# Patient Record
Sex: Female | Born: 1960 | Race: White | Hispanic: No | Marital: Married
Health system: Southern US, Community
[De-identification: ages and names within clinical notes are randomized; demographics above are authoritative.]

---

## 2021-03-11 ENCOUNTER — Emergency Department (HOSPITAL_COMMUNITY)
Admission: EM | Admit: 2021-03-11 | Discharge: 2021-03-11 | Disposition: A | Discharge status: Home / Self Care | Payer: BC Managed Care – PPO | Attending: Emergency Medicine | Admitting: Emergency Medicine

## 2021-03-11 ENCOUNTER — Emergency Department (HOSPITAL_COMMUNITY): Payer: BC Managed Care – PPO

## 2021-03-11 DIAGNOSIS — W228XXA Striking against or struck by other objects, initial encounter: Secondary | ICD-10-CM | POA: Insufficient documentation

## 2021-03-11 DIAGNOSIS — S0990XA Unspecified injury of head, initial encounter: Secondary | ICD-10-CM | POA: Diagnosis present

## 2021-03-11 DIAGNOSIS — Y9302 Activity, running: Secondary | ICD-10-CM | POA: Diagnosis not present

## 2021-03-11 DIAGNOSIS — S0993XA Unspecified injury of face, initial encounter: Secondary | ICD-10-CM | POA: Insufficient documentation

## 2021-03-11 MED ORDER — ONDANSETRON 4 MG PO TBDP: 4.0000 mg | ORAL_TABLET | Freq: Three times a day (TID) | ORAL | 0 refills | Status: AC | PRN

## 2021-03-11 NOTE — Discharge Instructions (Addendum)
Apply ice for comfort, this also for swelling.  If the swelling goes down and you are concerned about the shape or function of the nose follow-up with ENT group below.  Headache use over-the-counter medications as described below.  Use Zofran for nausea vomiting.  Follow-up with primary care for clearance to return to physical activity.  You can take 600 mg of ibuprofen every 6 hours, you can take 1000 mg of Tylenol every 6 hours, you can alternate these every 3 or you can take them together.   [facility]  Located in: [facility] Address: [address] [place], [place], [place] [address] Phone: [phone]

## 2021-03-11 NOTE — ED Triage Notes (Signed)
Pt ran into a door at the gym , pt hit her nose , pt is slightly  Swollen and crooked

## 2021-03-11 NOTE — ED Provider Notes (Signed)
[facility] EMERGENCY DEPARTMENT Provider Note   CSN: [id number] Arrival date & time: 03/11/21  0630     History No chief complaint on file. Notes injury  Crystal Valdez is a 60 y.o. female with no significant medical history presents for evaluation of nose pain.  Patient states she went to the gym to workout she ran into a glass door.  She has had nose tenderness since then and also became dizzy after her workout.  She has had associated swelling of the nose with some difficulty breathing from the right nostril.  Patient denies any headaches, blurry vision, nosebleeds, injury to the mouth, shortness of breath or chest pain.    No past medical history on file.  There are no problems to display for this patient.    OB History   No obstetric history on file.     No family history on file.     Home Medications Prior to Admission medications   Medication Sig Start Date End Date Taking? Authorizing Provider  ondansetron (ZOFRAN ODT) 4 MG disintegrating tablet Take 1 tablet (4 mg total) by mouth every 8 (eight) hours as needed for up to 10 doses for nausea or vomiting. 03/11/21  Yes Sabino Donovan, MD    Allergies    Patient has no allergy information on record.  Review of Systems   Review of Systems  Constitutional: Negative for activity change, chills and fever.  HENT: Positive for sinus pain. Negative for nosebleeds and tinnitus.        Nose pain  Eyes: Negative for pain and visual disturbance.  Respiratory: Negative for shortness of breath.   Gastrointestinal: Negative for nausea and vomiting.  Musculoskeletal: Negative for back pain and neck pain.  Skin: Negative for rash.  Neurological: Negative for dizziness, weakness and light-headedness.    Physical Exam Updated Vital Signs BP 120/64 (BP Location: Right Arm)   Pulse (!) 42   Temp 97.8 F (36.6 C) (Oral)   Resp 16   SpO2 100%   Physical Exam Constitutional:      Appearance: Normal  appearance.  HENT:     Head: Normocephalic and atraumatic.     Nose: No congestion or rhinorrhea.     Comments: Mild tenderness to palpation of the nose with mild edema of the superior aspect of the nose. Patent nares. No evidence of bleeding.     Mouth/Throat:     Mouth: Mucous membranes are moist.     Pharynx: Oropharynx is clear.  Eyes:     Extraocular Movements: Extraocular movements intact.     Conjunctiva/sclera: Conjunctivae normal.     Pupils: Pupils are equal, round, and reactive to light.  Cardiovascular:     Rate and Rhythm: Normal rate.     Pulses: Normal pulses.     Heart sounds: Normal heart sounds.  Pulmonary:     Effort: Pulmonary effort is normal.     Breath sounds: Normal breath sounds.  Abdominal:     General: Abdomen is flat.  Musculoskeletal:        General: Normal range of motion.     Cervical back: Normal range of motion and neck supple. No rigidity.  Skin:    General: Skin is warm and dry.  Neurological:     General: No focal deficit present.     Mental Status: She is alert and oriented to person, place, and time.     ED Results / Procedures / Treatments   Labs (all labs  ordered are listed, but only abnormal results are displayed) Labs Reviewed - No data to display  EKG None  Radiology CT Head Wo Contrast  Result Date: 03/11/2021 CLINICAL DATA:  Blunt facial trauma, initial encounter EXAM: CT HEAD WITHOUT CONTRAST CT MAXILLOFACIAL WITHOUT CONTRAST TECHNIQUE: Multidetector CT imaging of the head and maxillofacial structures were performed using the standard protocol without intravenous contrast. Multiplanar CT image reconstructions of the maxillofacial structures were also generated. COMPARISON:  None. FINDINGS: CT HEAD FINDINGS Brain: No evidence of acute infarction, hemorrhage, hydrocephalus, extra-axial collection or mass lesion/mass effect. Calcification is noted in the left occipital lobe along the tentorium. Vascular: No hyperdense vessel or  unexpected calcification. Skull: Normal. Negative for fracture or focal lesion. Other: None. CT MAXILLOFACIAL FINDINGS Osseous: No acute bony fracture is noted. Orbits: Orbits and their contents are within normal limits. Sinuses: Paranasal sinuses are well aerated. Soft tissues: No soft tissue swelling is seen. No radiopaque foreign bodies are noted. IMPRESSION: CT of the head: No acute intracranial abnormality noted. Tentorial calcifications are noted on the left. CT of the maxillofacial bones: No acute abnormality noted. Electronically Signed   By: Alcide Clever M.D.   On: 03/11/2021 11:24   CT Maxillofacial Wo Contrast  Result Date: 03/11/2021 CLINICAL DATA:  Blunt facial trauma, initial encounter EXAM: CT HEAD WITHOUT CONTRAST CT MAXILLOFACIAL WITHOUT CONTRAST TECHNIQUE: Multidetector CT imaging of the head and maxillofacial structures were performed using the standard protocol without intravenous contrast. Multiplanar CT image reconstructions of the maxillofacial structures were also generated. COMPARISON:  None. FINDINGS: CT HEAD FINDINGS Brain: No evidence of acute infarction, hemorrhage, hydrocephalus, extra-axial collection or mass lesion/mass effect. Calcification is noted in the left occipital lobe along the tentorium. Vascular: No hyperdense vessel or unexpected calcification. Skull: Normal. Negative for fracture or focal lesion. Other: None. CT MAXILLOFACIAL FINDINGS Osseous: No acute bony fracture is noted. Orbits: Orbits and their contents are within normal limits. Sinuses: Paranasal sinuses are well aerated. Soft tissues: No soft tissue swelling is seen. No radiopaque foreign bodies are noted. IMPRESSION: CT of the head: No acute intracranial abnormality noted. Tentorial calcifications are noted on the left. CT of the maxillofacial bones: No acute abnormality noted. Electronically Signed   By: Alcide Clever M.D.   On: 03/11/2021 11:24    Procedures Procedures   Medications Ordered in  ED Medications - No data to display  ED Course  I have reviewed the triage vital signs and the nursing notes.  Pertinent labs & imaging results that were available during my care of the patient were reviewed by me and considered in my medical decision making (see chart for details).    MDM Rules/Calculators/A&P                          60 year old female here for evaluation of nose pain after running into a glass door while at the gym. Patient endorsed nose pain with mild dizziness and swelling. On exam, patient has some tenderness to palpation of the nose with mild swelling but nares are patent, no evidence of epistaxis and no shortness of breath. CT head shows no acute intracranial abnormality. CT maxillofacial negative for acute bony fracture or soft tissue swelling. Patient discharged home with instructions for conservative management of her facial injury and instructions to follow-up with ENT as needed. Patient was also given as needed Zofran for nausea.  Final Clinical Impression(s) / ED Diagnoses Final diagnoses:  Facial injury, initial encounter  Rx / DC Orders ED Discharge Orders         Ordered    ondansetron (ZOFRAN ODT) 4 MG disintegrating tablet  Every 8 hours PRN        03/11/21 1202           Steffanie Rainwater, MD 03/11/21 1556    Sabino Donovan, MD 03/12/21 [id number]

## 2022-04-01 IMAGING — CT CT MAXILLOFACIAL W/O CM
3 series · 16 of 47 positions shown, 19 images · non-contrast
Comparison: None.

CLINICAL DATA: Blunt facial trauma, initial encounter

EXAM:
CT HEAD WITHOUT CONTRAST
CT MAXILLOFACIAL WITHOUT CONTRAST
TECHNIQUE: Multidetector CT imaging of the head and maxillofacial structures
were performed using the standard protocol without intravenous
contrast. Multiplanar CT image reconstructions of the maxillofacial
structures were also generated.

[Series 3: facialbone 2.0 st · axial · 0.33mm/px · z∈[-34,+94]mm · 10 of 76 slices shown, 13 images]
[im 6/76  brain]
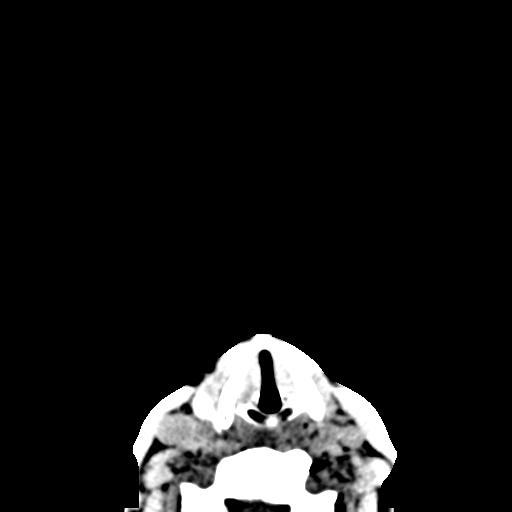
[im 6/76  bone]
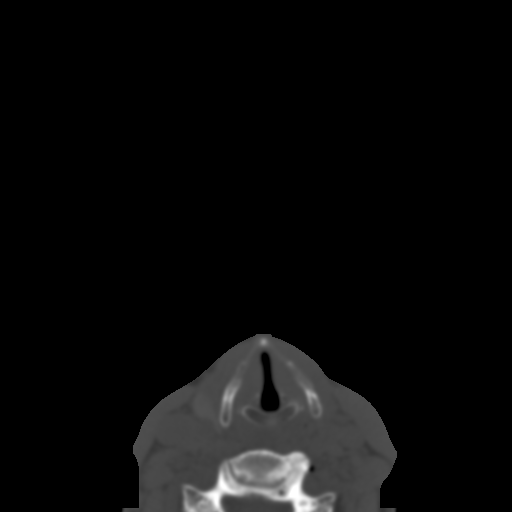
[im 13/76  bone]
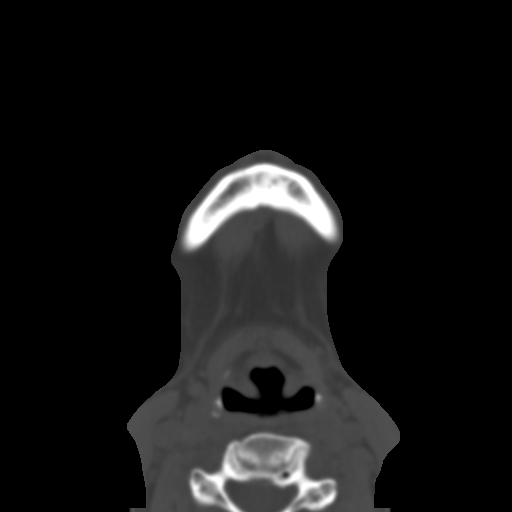
[im 21/76  bone]
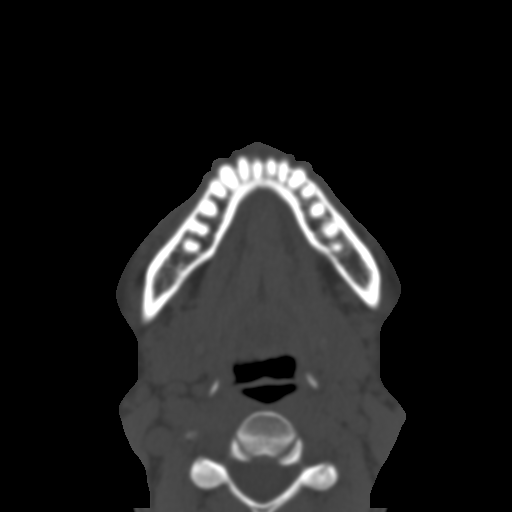
[im 26/76  bone]
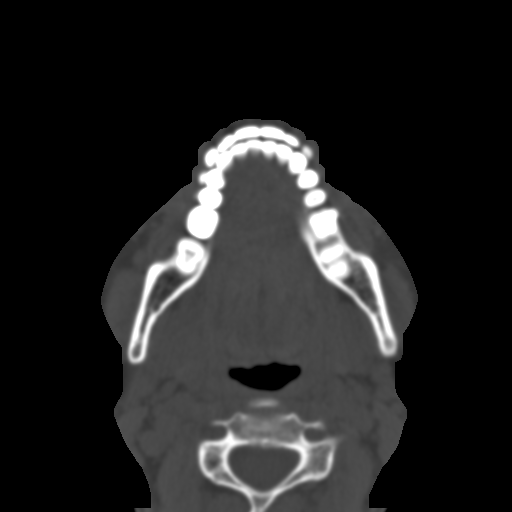
[im 34/76  brain]
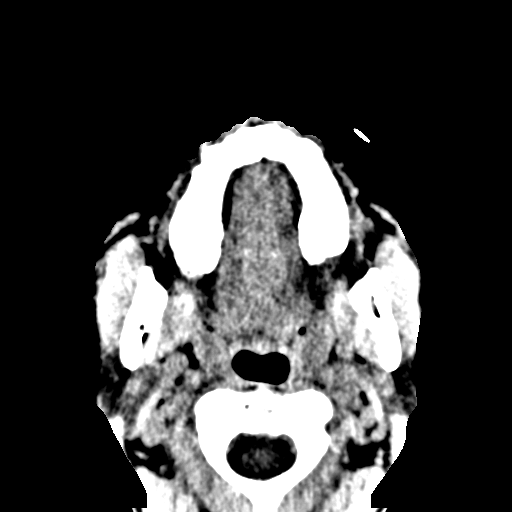
[im 34/76  bone]
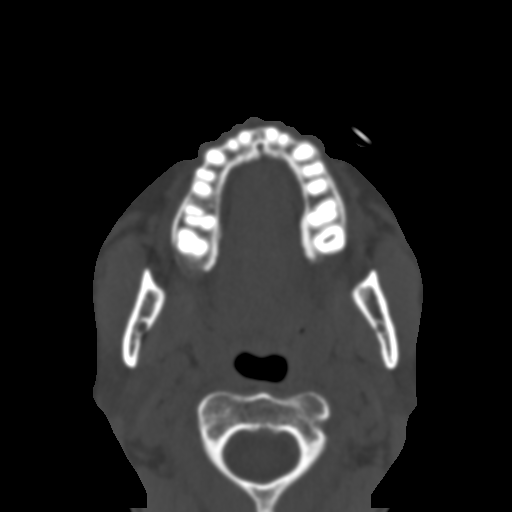
[im 42/76  bone]
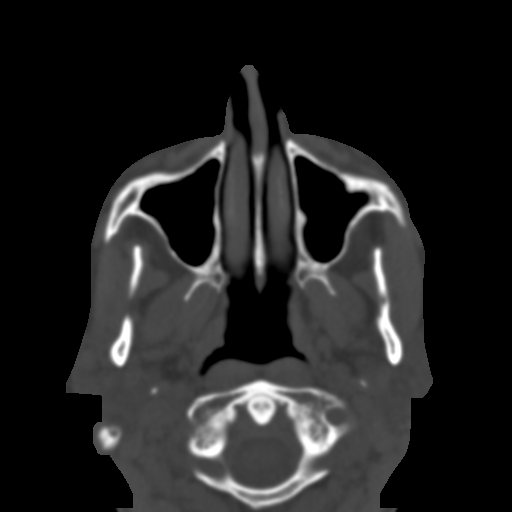
[im 50/76  bone]
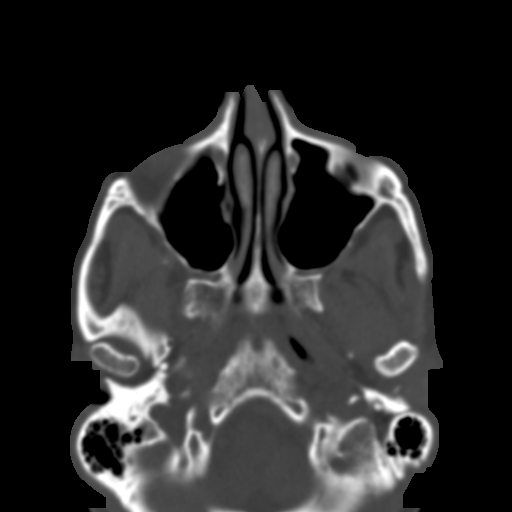
[im 57/76  bone]
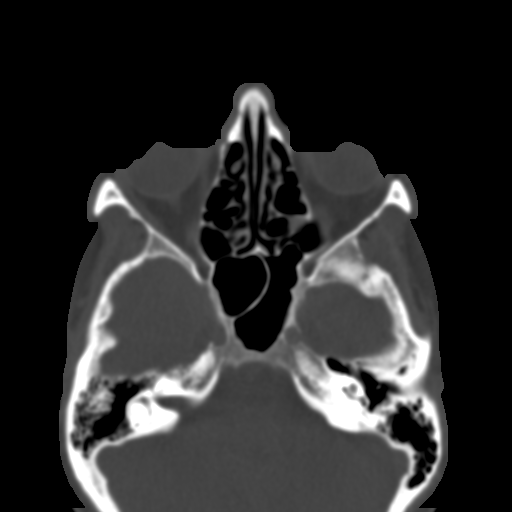
[im 63/76  brain]
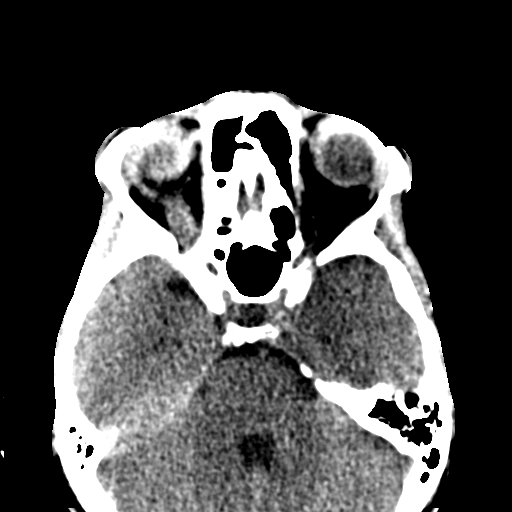
[im 63/76  bone]
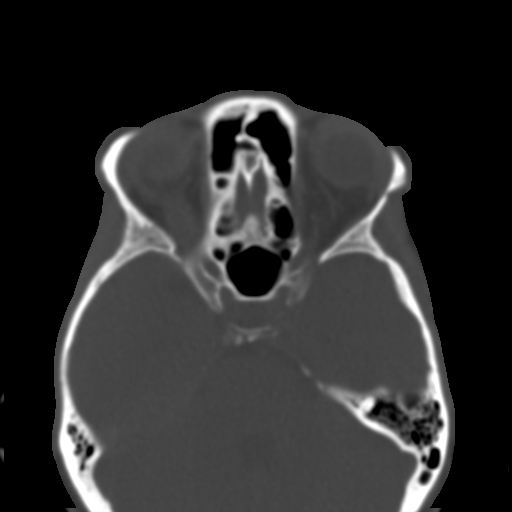
[im 70/76  bone]
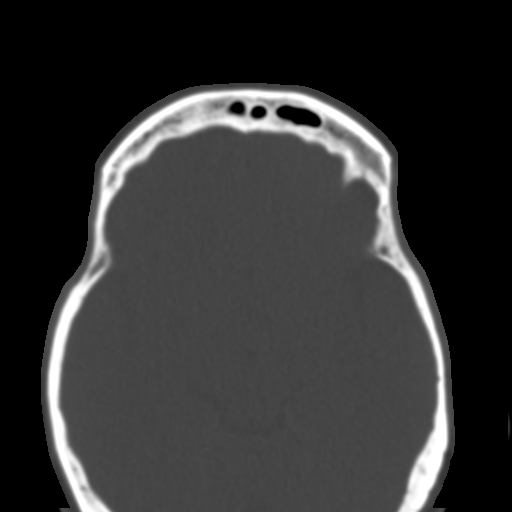

[Series 8: facialbone 2.0 cor st · coronal · 0.32mm/px · 3 of 76 slices shown]
[im 26/76  bone]
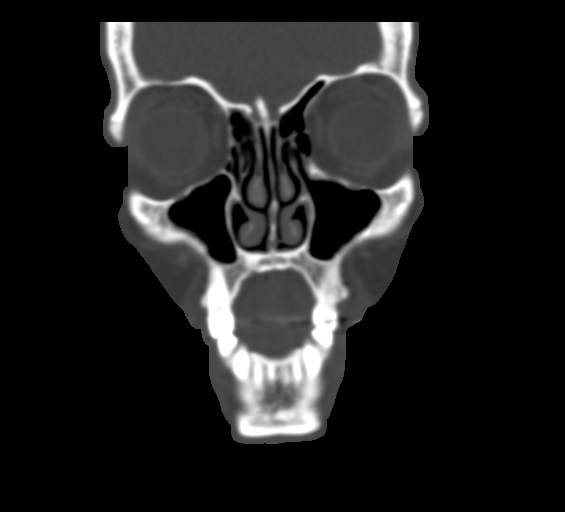
[im 34/76  bone]
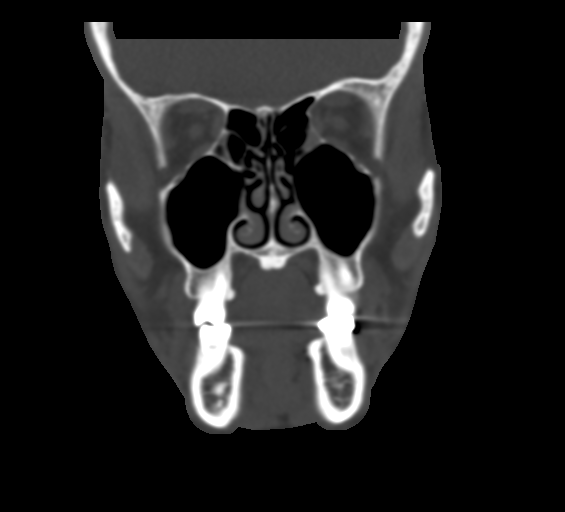
[im 42/76  bone]
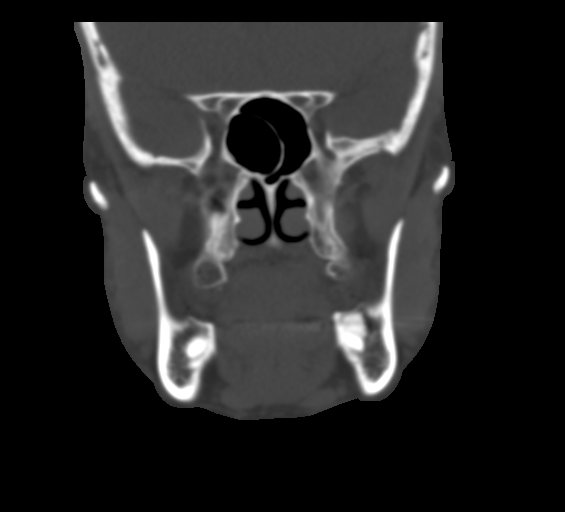

[Series 9: facialbone 2.0 sag st · sagittal · 0.29mm/px · 3 of 88 slices shown]
[im 30/88  bone]
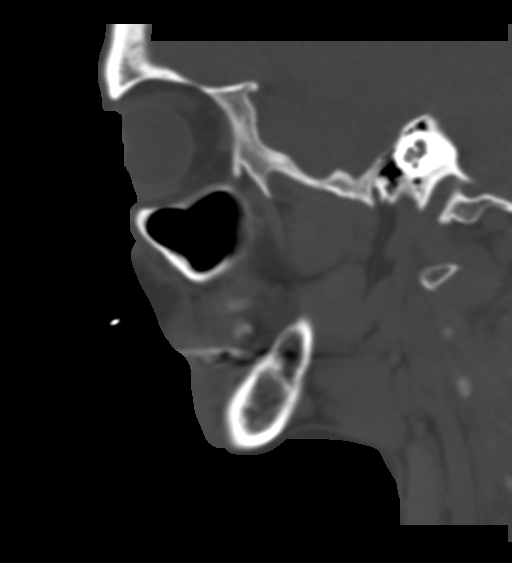
[im 44/88  bone]
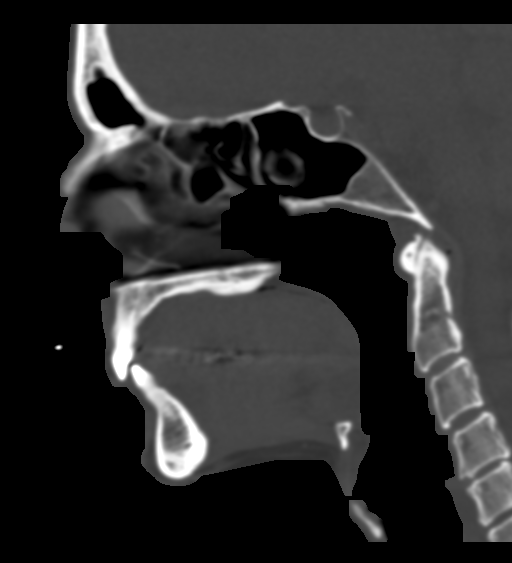
[im 59/88  bone]
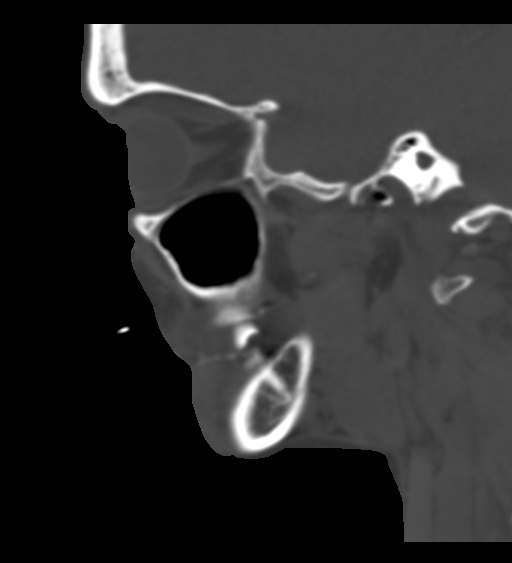

[16 of 47 positions shown; findings below may reference images not displayed]

FINDINGS: CT HEAD FINDINGS

Brain: No evidence of acute infarction, hemorrhage, hydrocephalus,
extra-axial collection or mass lesion/mass effect. Calcification is
noted in the left occipital lobe along the tentorium.

Vascular: No hyperdense vessel or unexpected calcification.

Skull: Normal. Negative for fracture or focal lesion.

Other: None.

CT MAXILLOFACIAL FINDINGS

Osseous: No acute bony fracture is noted.

Orbits: Orbits and their contents are within normal limits.

Sinuses: Paranasal sinuses are well aerated.

Soft tissues: No soft tissue swelling is seen. No radiopaque foreign
bodies are noted.
IMPRESSION: CT of the head: No acute intracranial abnormality noted. Tentorial
calcifications are noted on the left.

CT of the maxillofacial bones: No acute abnormality noted.
